# Patient Record
Sex: Male | Born: 1975 | Race: Black or African American | Hispanic: No | Smoking: Never smoker
Health system: Southern US, Community
[De-identification: ages and names within clinical notes are randomized; demographics above are authoritative.]

---

## 1998-12-31 ENCOUNTER — Emergency Department (HOSPITAL_COMMUNITY): Admission: EM | Admit: 1998-12-31 | Discharge: 1998-12-31 | Payer: Self-pay | Admitting: Emergency Medicine

## 1999-01-04 ENCOUNTER — Emergency Department (HOSPITAL_COMMUNITY): Admission: EM | Admit: 1999-01-04 | Discharge: 1999-01-04 | Payer: Self-pay | Admitting: Emergency Medicine

## 2001-03-11 ENCOUNTER — Emergency Department (HOSPITAL_COMMUNITY): Admission: EM | Admit: 2001-03-11 | Discharge: 2001-03-11 | Payer: Self-pay | Admitting: Emergency Medicine

## 2001-08-11 ENCOUNTER — Emergency Department (HOSPITAL_COMMUNITY): Admission: EM | Admit: 2001-08-11 | Discharge: 2001-08-11 | Payer: Self-pay | Admitting: Emergency Medicine

## 2001-08-17 ENCOUNTER — Emergency Department (HOSPITAL_COMMUNITY): Admission: EM | Admit: 2001-08-17 | Discharge: 2001-08-17 | Payer: Self-pay | Admitting: Emergency Medicine

## 2001-08-17 ENCOUNTER — Encounter: Payer: Self-pay | Admitting: Emergency Medicine

## 2004-04-30 ENCOUNTER — Emergency Department (HOSPITAL_COMMUNITY): Admission: EM | Admit: 2004-04-30 | Discharge: 2004-04-30 | Payer: Self-pay | Admitting: *Deleted

## 2004-05-02 ENCOUNTER — Emergency Department (HOSPITAL_COMMUNITY): Admission: EM | Admit: 2004-05-02 | Discharge: 2004-05-02 | Payer: Self-pay | Admitting: Family Medicine

## 2004-11-18 ENCOUNTER — Encounter: Admission: RE | Admit: 2004-11-18 | Discharge: 2004-11-18 | Payer: Self-pay | Admitting: Specialist

## 2007-05-25 ENCOUNTER — Emergency Department (HOSPITAL_COMMUNITY): Admission: EM | Admit: 2007-05-25 | Discharge: 2007-05-25 | Payer: Self-pay | Admitting: Family Medicine
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
## 2013-10-11 ENCOUNTER — Encounter (HOSPITAL_BASED_OUTPATIENT_CLINIC_OR_DEPARTMENT_OTHER): Payer: Self-pay | Admitting: Emergency Medicine

## 2013-10-11 ENCOUNTER — Emergency Department (HOSPITAL_BASED_OUTPATIENT_CLINIC_OR_DEPARTMENT_OTHER)
Admission: EM | Admit: 2013-10-11 | Discharge: 2013-10-11 | Disposition: A | Discharge status: Home / Self Care | Payer: BC Managed Care – PPO | Attending: Emergency Medicine | Admitting: Emergency Medicine

## 2013-10-11 DIAGNOSIS — K5900 Constipation, unspecified: Secondary | ICD-10-CM | POA: Diagnosis present

## 2013-10-11 DIAGNOSIS — R3129 Other microscopic hematuria: Secondary | ICD-10-CM | POA: Insufficient documentation

## 2013-10-11 DIAGNOSIS — R109 Unspecified abdominal pain: Secondary | ICD-10-CM | POA: Diagnosis not present

## 2013-10-11 LAB — URINALYSIS, ROUTINE W REFLEX MICROSCOPIC
Bilirubin Urine: NEGATIVE
Glucose, UA: NEGATIVE mg/dL
Ketones, ur: NEGATIVE mg/dL
Leukocytes, UA: NEGATIVE
Nitrite: NEGATIVE
Protein, ur: NEGATIVE mg/dL
Specific Gravity, Urine: 1.012 (ref 1.005–1.030)
Urobilinogen, UA: 0.2 mg/dL (ref 0.0–1.0)
pH: 7.5 (ref 5.0–8.0)

## 2013-10-11 LAB — URINE MICROSCOPIC-ADD ON

## 2013-10-11 MED ORDER — POLYETHYLENE GLYCOL 3350 17 G PO PACK: 17.0000 g | PACK | Freq: Every day | ORAL | Status: AC

## 2013-10-11 MED ORDER — DOCUSATE SODIUM 100 MG PO CAPS: 100.0000 mg | ORAL_CAPSULE | Freq: Two times a day (BID) | ORAL | Status: AC

## 2013-10-11 MED ORDER — PANTOPRAZOLE SODIUM 20 MG PO TBEC: 20.0000 mg | DELAYED_RELEASE_TABLET | Freq: Every day | ORAL | Status: AC

## 2013-10-11 NOTE — Discharge Instructions (Signed)
Constipation °Constipation is when a person has fewer than three bowel movements a week, has difficulty having a bowel movement, or has stools that are dry, hard, or larger than normal. As people grow older, constipation is more common. If you try to fix constipation with medicines that make you have a bowel movement (laxatives), the problem may get worse. Long-term laxative use may cause the muscles of the colon to become weak. A low-fiber diet, not taking in enough fluids, and taking certain medicines may make constipation worse.  °CAUSES  °· Certain medicines, such as antidepressants, pain medicine, iron supplements, antacids, and water pills.   °· Certain diseases, such as diabetes, irritable bowel syndrome (IBS), thyroid disease, or depression.   °· Not drinking enough water.   °· Not eating enough fiber-rich foods.   °· Stress or travel.   °· Lack of physical activity or exercise.   °· Ignoring the urge to have a bowel movement.   °· Using laxatives too much.   °SIGNS AND SYMPTOMS  °· Having fewer than three bowel movements a week.   °· Straining to have a bowel movement.   °· Having stools that are hard, dry, or larger than normal.   °· Feeling full or bloated.   °· Pain in the lower abdomen.   °· Not feeling relief after having a bowel movement.   °DIAGNOSIS  °Your health care provider will take a medical history and perform a physical exam. Further testing may be done for severe constipation. Some tests may include: °· A barium enema X-ray to examine your rectum, colon, and, sometimes, your small intestine.   °· A sigmoidoscopy to examine your lower colon.   °· A colonoscopy to examine your entire colon. °TREATMENT  °Treatment will depend on the severity of your constipation and what is causing it. Some dietary treatments include drinking more fluids and eating more fiber-rich foods. Lifestyle treatments may include regular exercise. If these diet and lifestyle recommendations do not help, your health care  provider may recommend taking over-the-counter laxative medicines to help you have bowel movements. Prescription medicines may be prescribed if over-the-counter medicines do not work.  °HOME CARE INSTRUCTIONS  °· Eat foods that have a lot of fiber, such as fruits, vegetables, whole grains, and beans. °· Limit foods high in fat and processed sugars, such as french fries, hamburgers, cookies, candies, and soda.   °· A fiber supplement may be added to your diet if you cannot get enough fiber from foods.   °· Drink enough fluids to keep your urine clear or pale yellow.   °· Exercise regularly or as directed by your health care provider.   °· Go to the restroom when you have the urge to go. Do not hold it.   °· Only take over-the-counter or prescription medicines as directed by your health care provider. Do not take other medicines for constipation without talking to your health care provider first.   °SEEK IMMEDIATE MEDICAL CARE IF:  °· You have bright red blood in your stool.   °· Your constipation lasts for more than 4 days or gets worse.   °· You have abdominal or rectal pain.   °· You have thin, pencil-like stools.   °· You have unexplained weight loss. °MAKE SURE YOU:  °· Understand these instructions. °· Will watch your condition. °· Will get help right away if you are not doing well or get worse. °Document Released: 10/26/2003 Document Revised: 02/01/2013 Document Reviewed: 11/08/2012 °ExitCare® Patient Information ©2015 [employer]. This information is not intended to replace advice given to you by your health care provider. Make sure you discuss any questions   you have with your health care provider.   High-Fiber Diet Fiber is found in fruits, vegetables, and grains. A high-fiber diet encourages the addition of more whole grains, legumes, fruits, and vegetables in your diet. The recommended amount of fiber for adult males is 38 g per day. For adult females, it is 25 g per day. Pregnant and lactating women  should get 28 g of fiber per day. If you have a digestive or bowel problem, ask your caregiver for advice before adding high-fiber foods to your diet. Eat a variety of high-fiber foods instead of only a select few type of foods.  PURPOSE  To increase stool bulk.  To make bowel movements more regular to prevent constipation.  To lower cholesterol.  To prevent overeating. WHEN IS THIS DIET USED?  It may be used if you have constipation and hemorrhoids.  It may be used if you have uncomplicated diverticulosis (intestine condition) and irritable bowel syndrome.  It may be used if you need help with weight management.  It may be used if you want to add it to your diet as a protective measure against atherosclerosis, diabetes, and cancer. SOURCES OF FIBER  Whole-grain breads and cereals.  Fruits, such as apples, oranges, bananas, berries, prunes, and pears.  Vegetables, such as green peas, carrots, sweet potatoes, beets, broccoli, cabbage, spinach, and artichokes.  Legumes, such split peas, soy, lentils.  Almonds. FIBER CONTENT IN FOODS Starches and Grains / Dietary Fiber (g)  Cheerios, 1 cup / 3 g  Corn Flakes cereal, 1 cup / 0.7 g  Rice crispy treat cereal, 1 cup / 0.3 g  Instant oatmeal (cooked),  cup / 2 g  Frosted wheat cereal, 1 cup / 5.1 g  Brown, long-grain rice (cooked), 1 cup / 3.5 g  White, long-grain rice (cooked), 1 cup / 0.6 g  Enriched macaroni (cooked), 1 cup / 2.5 g Legumes / Dietary Fiber (g)  Baked beans (canned, plain, or vegetarian),  cup / 5.2 g  Kidney beans (canned),  cup / 6.8 g  Pinto beans (cooked),  cup / 5.5 g Breads and Crackers / Dietary Fiber (g)  Plain or honey graham crackers, 2 squares / 0.7 g  Saltine crackers, 3 squares / 0.3 g  Plain, salted pretzels, 10 pieces / 1.8 g  Whole-wheat bread, 1 slice / 1.9 g  White bread, 1 slice / 0.7 g  Raisin bread, 1 slice / 1.2 g  Plain bagel, 3 oz / 2 g  Flour tortilla, 1 oz  / 0.9 g  Corn tortilla, 1 small / 1.5 g  Hamburger or hotdog bun, 1 small / 0.9 g Fruits / Dietary Fiber (g)  Apple with skin, 1 medium / 4.4 g  Sweetened applesauce,  cup / 1.5 g  Banana,  medium / 1.5 g  Grapes, 10 grapes / 0.4 g  Orange, 1 small / 2.3 g  Raisin, 1.5 oz / 1.6 g  Melon, 1 cup / 1.4 g Vegetables / Dietary Fiber (g)  Green beans (canned),  cup / 1.3 g  Carrots (cooked),  cup / 2.3 g  Broccoli (cooked),  cup / 2.8 g  Peas (cooked),  cup / 4.4 g  Mashed potatoes,  cup / 1.6 g  Lettuce, 1 cup / 0.5 g  Corn (canned),  cup / 1.6 g  Tomato,  cup / 1.1 g Document Released: 01/27/2005 Document Revised: 07/29/2011 Document Reviewed: 05/01/2011 ExitCare Patient Information 2015 [place], [place]. This information is not intended to  replace advice given to you by your health care provider. Make sure you discuss any questions you have with your health care provider.   Hematuria Hematuria is blood in your urine. It can be caused by a bladder infection, kidney infection, prostate infection, kidney stone, or cancer of your urinary tract. Infections can usually be treated with medicine, and a kidney stone usually will pass through your urine. If neither of these is the cause of your hematuria, further workup to find out the reason may be needed. It is very important that you tell your health care provider about any blood you see in your urine, even if the blood stops without treatment or happens without causing pain. Blood in your urine that happens and then stops and then happens again can be a symptom of a very serious condition. Also, pain is not a symptom in the initial stages of many urinary cancers. HOME CARE INSTRUCTIONS   Drink lots of fluid, 3-4 quarts a day. If you have been diagnosed with an infection, cranberry juice is especially recommended, in addition to large amounts of water.  Avoid caffeine, tea, and carbonated beverages because they tend to  irritate the bladder.  Avoid alcohol because it may irritate the prostate.  Take all medicines as directed by your health care provider.  If you were prescribed an antibiotic medicine, finish it all even if you start to feel better.  If you have been diagnosed with a kidney stone, follow your health care provider's instructions regarding straining your urine to catch the stone.  Empty your bladder often. Avoid holding urine for long periods of time.  After a bowel movement, women should cleanse front to back. Use each tissue only once.  Empty your bladder before and after sexual intercourse if you are a male. SEEK MEDICAL CARE IF:  You develop back pain.  You have a fever.  You have a feeling of sickness in your stomach (nausea) or vomiting.  Your symptoms are not better in 3 days. Return sooner if you are getting worse. SEEK IMMEDIATE MEDICAL CARE IF:   You develop severe vomiting and are unable to keep the medicine down.  You develop severe back or abdominal pain despite taking your medicines.  You begin passing a large amount of blood or clots in your urine.  You feel extremely weak or faint, or you pass out. MAKE SURE YOU:   Understand these instructions.  Will watch your condition.  Will get help right away if you are not doing well or get worse. Document Released: 01/27/2005 Document Revised: 06/13/2013 Document Reviewed: 09/27/2012 [facility] Patient Information 2015 [place], [place]. This information is not intended to replace advice given to you by your health care provider. Make sure you discuss any questions you have with your health care provider.     Emergency Department Resource Guide 1) Find a Doctor and Pay Out of Pocket Although you won't have to find out who is covered by your insurance plan, it is a good idea to ask around and get recommendations. You will then need to call the office and see if the doctor you have chosen will accept you as a new  patient and what types of options they offer for patients who are self-pay. Some doctors offer discounts or will set up payment plans for their patients who do not have insurance, but you will need to ask so you aren't surprised when you get to your appointment.  2) Contact Your Local Health Department Not  all health departments have doctors that can see patients for sick visits, but many do, so it is worth a call to see if yours does. If you don't know where your local health department is, you can check in your phone book. The CDC also has a tool to help you locate your state's health department, and many state websites also have listings of all of their local health departments.  3) Find a Walk-in Clinic If your illness is not likely to be very severe or complicated, you may want to try a walk in clinic. These are popping up all over the country in pharmacies, drugstores, and shopping centers. They're usually staffed by nurse practitioners or physician assistants that have been trained to treat common illnesses and complaints. They're usually fairly quick and inexpensive. However, if you have serious medical issues or chronic medical problems, these are probably not your best option.  No Primary Care Doctor: - Call Health Connect at  [phone] - they can help you locate a primary care doctor that  accepts your insurance, provides certain services, etc. - Physician Referral Service- [phone]  Chronic Pain Problems: Organization         Address  Phone   Notes  [facility]  [phone] Patients need to be referred by their primary care doctor.   Medication Assistance: Organization         Address  Phone   Notes  [facility] Medication [facility] [address] [place] [place], [place] [address] [phone] --Must be a resident of [facility] -- Must have NO insurance coverage whatsoever (no Medicaid/ Medicare, etc.) -- The pt. MUST have a primary  care doctor that directs their care regularly and follows them in the community   [facility]  [phone]   [employer]  [phone]    Agencies that provide inexpensive medical care: Organization         Address  Phone   Notes  [facility]  [phone]   [facility]    [phone]   [facility] [address] [place], [place] [address] [phone]   [facility] [address] [place], [place] [phone]   Planned Parenthood    [phone]   [facility]    [phone]   Community Health and [facility]  [address], [place] Phone:  [phone], Fax:  [phone] Hours of Operation:  9 am - 6 pm, M-F.  Also accepts Medicaid/Medicare and self-pay.  [facility]  [address], [place] Phone: [phone], Fax: [phone]. Hours of Operation:  8:30 am - 5:30 pm, M-F.  Also accepts Medicaid and self-pay.  [facility] [place] [address], [place] Phone: [phone]   [facility] [address] Natasha Bence [place], [place] [phone], Ext. [phone] Mondays & Thursdays: 7-9 AM.  First 15 patients are seen on a first come, first serve basis.    Medicaid-accepting [facility] Providers:  Organization         Address  Phone   Notes  [facility] [address], [place] [phone] Also accepts self-pay patients.  [facility] [address] Laurell Josephs [place], [place]  [phone]   [facility] [address], [place] [phone]   [facility] [address], [place] [phone]   Renaye Rakers [address], [place]   [phone] Only accepts [place] Access [place] patients after they have their name applied to their card.   Self-Pay (no insurance) in [facility]:  Organization         Address  Phone   Notes  Sickle Cell Patients, [facility] [address] [place], [place] [phone]   [facility] [address] [place], [place] [phone]   [facility] [place]  [address], [place] [phone]   [facility]/Dr. Osei-Bonsu  [address] [place] or [address], [place] [phone] Phone number for both [place] and [place] locations is the same.  [facility] [address], [place] [phone]   [facility] [address], [place] or [address] [phone]   [facility] [address], [place] [phone], phone; [phone], fax Sees patients 1st and 3rd Saturday of every month.  Must not qualify for public or private insurance (i.e. Medicaid, Medicare, [place] Health Choice, Veterans' Benefits)  Household income should be no more than 200% of the poverty level The clinic cannot treat you if you are pregnant or think you are pregnant  Sexually transmitted diseases are not treated at the clinic.    Dental Care: Organization         Address  Phone  Notes  [facility] Behavioral [facility] [address] [place], [place] [phone] Accepts children up to age 72 who are enrolled in [place] or [place] Health Choice; pregnant women with a Medicaid card; and children who have applied for Medicaid or [place] Health Choice, but were declined, whose parents can pay a reduced fee at time of service.  [facility] Department of [facility]  [address], [place] [phone] Accepts children up to age 63 who are enrolled in [place] or [place] Health Choice; pregnant women with a Medicaid card; and children who have applied for Medicaid or  Health Choice, but were declined, whose parents can  pay a reduced fee at time of service.  [facility]  [address] [place], [place] [phone] Patients are seen by appointment only. Walk-ins are not accepted. [facility] will see patients 19 years of age and older. Monday - Tuesday (8am-5pm) Most Wednesdays (8:30-5pm) $30 per visit, cash only  [facility] Adult [facility] Access PRO[facility]  [address], [facility] [phone] Patients are seen by appointment only. Walk-ins are not accepted. [facility] will see patients 51 years of age and older. One Wednesday Evening (Monthly: Volunteer Based).  $30 per visit, cash only  [employer]  [phone] for adults; Children under age 65, call Graduate Pediatric Dentistry at [phone]. Children aged 78-14, please call [phone] to request a pediatric application.  Dental services are provided in all areas of dental care including fillings, crowns and bridges, complete and partial dentures, implants, gum treatment, root canals, and extractions. Preventive care is also provided. Treatment is provided to both adults and children. Patients are selected via a lottery and there is often a waiting list.   [facility] [address], [place]  [phone] [email or web address]   [facility] [address] [place], [place] [phone], Ext. [phone] Second and Fourth Thursday of each month, opens at 6:30 AM; Clinic ends at 9 AM.  Patients are seen on a first-come first-served basis, and a limited number are seen during each clinic.   [facility]  [address] Ether Griffins [place], [place] [phone]   Eligibility Requirements You must have lived in [place], [place], or [place] counties for at least the last three months.   You cannot be eligible for state or federal sponsored [place], including [employer], [place], or [employer].   You generally cannot be eligible for healthcare  insurance through your employer.    How to apply: Eligibility screenings are held every Tuesday and Wednesday afternoon from 1:00 pm until 4:00 pm. You do not need an appointment for the interview!  [facility] [address] [place], [place] [phone]   [facility] Health Department  [phone]   [facility] Health Department  [phone]   [facility] Health Department  [phone]    Behavioral Health Resources in the Community: Intensive Outpatient Programs Organization         Address  Phone  Notes  [facility] [address] [place], [place] [phone]   [facility] [address], [place], [place] [phone]   [facility] [address] [place], [place]  [phone]   [facility] Health [address]  [place], [place] [phone] or [phone]   Substance Abuse Resources Organization         Address  Phone  Notes  [facility]  [phone]   [facility]  [phone]   [facility]  [phone]   Floydene Flock  [phone]   Residential & Outpatient Substance Abuse Program  [phone]   Psychological Services Organization         Address  Phone  Notes  [facility] Behavioral Health  [phone]   [facility]  [phone]   [facility] Mental Health [address] [place] [phone] or [phone]    Mobile Crisis Teams Organization         Address  Phone  Notes  [facility], Mobile Crisis Care Unit  [phone]   [facility]  [address] [place], [place] [phone]   Doristine Locks [address] [place] [phone]    Self-Help/Support Groups Organization         Address  Phone             Notes  [facility] [place] - variety of support groups  [phone]- [phone] Call for more information  Narcotics Anonymous (NA),  [facility] [address], [employer] [place]  2 meetings at this location   Statistician         Address  Phone  Notes  [facility] [address],    [place]  [phone]   [facility]  [address] [place] [address], [place], [place] [phone]   [facility] [address] [place], [place] [phone] Admissions: 8am-3pm M-F  [facility] [address],    [place], [place] [phone]   [facility] [address] Starling Manns [place], [place] [phone]   [facility] [address],  [place], [place] [phone]   [facility] - Intensive Outpatient [phone]  [address], [place], [place] [phone]   [facility] ([facility] [address]  [place], [place] [phone] or [phone]   [facility] ([facility]) [address], [place], [place] [phone] Accepts Medicaid  [facility] [address],  [place] [phone] Substance Abuse/Addiction Treatment   [facility] Organization         Address  Phone  Notes  [facility]  [phone]   Angie Fava, PhD [address] Ervin Knack [place], [place]   [phone] or [phone]   [facility] Behavioral   [address] [place], [place] [phone]   [facility] [address] [place], [place] [phone] Insurance/Medicaid/sponsorship through [facility] [address]                                    [place], [place] [phone] Therapy/tele-psych/case  [facility] [address] [place], [place] [phone]    Dr. Lolly Mustache  [phone]   [facility]  [employer] [facility] Dept. 1) [address], [place] 2) [address] [place] 3)  [address], [place] [phone]  [phone]    [facility] Child Abuse Hotline [phone] or [phone] (After Hours)

## 2013-10-11 NOTE — ED Notes (Signed)
Pt c/o abdominal bloating, gas and constipation; states "i'm not digesting food at all", states last bm yesterday, denies n/v

## 2013-10-11 NOTE — ED Provider Notes (Signed)
CSN: [id number]     Arrival date & time 10/11/13  2036 History  This chart was scribed for Audree Camel, MD by Roxy Cedar, ED Scribe. This patient was seen in room MH11/MH11 and the patient's care was started at 11:18 PM.  Chief Complaint  Patient presents with  . Constipation   The history is provided by the patient. No language interpreter was used.   HPI Comments: Charles Maddox is a 38 y.o. male who presents to the Emergency Department complaining of constant constipation and difficulty digesting food that began 1 month ago. Patient states that his pain worsens with oil and nut based foods. Patient states that he used to take a laxative medication 3 to 4 times daily, but has stopped taking it on a daily basis. His last episode of pain was last night. Patient states that his last bowel movement was yesterday and that he had hard stool. Patient has associated periumbilical abdominal pain. Patient denies associated vomiting and diarrhea. Patient states he is not currently taking any medications. Patient states that his normal diet consists of eating out. He feels like he has burning going up his chest during certain foods and feels like certain foods aren't digested. Denies dysuria or hematuria.  History reviewed. No pertinent past medical history. History reviewed. No pertinent past surgical history. No family history on file. History  Substance Use Topics  . Smoking status: Never Smoker   . Smokeless tobacco: Not on file  . Alcohol Use: No    Review of Systems  Gastrointestinal: Positive for abdominal pain and constipation. Negative for nausea and vomiting.  Genitourinary: Negative for dysuria, frequency and hematuria.  All other systems reviewed and are negative.  Allergies  Review of patient's allergies indicates no known allergies.  Home Medications   Prior to Admission medications   Not on File   Triage Vitals: BP 133/84  Pulse 59  Temp(Src) 98.3 F (36.8 C)  (Oral)  Resp 20  Ht  (1.651 m)  Wt 155 lb (70.308 kg)  BMI 25.79 kg/m2  SpO2 100%  Physical Exam  Nursing note and vitals reviewed. Constitutional: He is oriented to person, place, and time. He appears well-developed and well-nourished. No distress.  HENT:  Head: Normocephalic and atraumatic.  Cardiovascular: Normal rate, regular rhythm and normal heart sounds.   Pulmonary/Chest: Effort normal and breath sounds normal. No respiratory distress.  Abdominal: Soft. He exhibits no distension. There is no tenderness.  Neurological: He is alert and oriented to person, place, and time.  Skin: Skin is warm and dry.  Psychiatric: He has a normal mood and affect. His behavior is normal.   ED Course  Procedures (including critical care time)  DIAGNOSTIC STUDIES: Oxygen Saturation is 100% on RA, normal by my interpretation.    COORDINATION OF CARE: 11:24 PM- Discussed plans to order urinalysis. Advised patient to schedule appointment with GI specialist. Pt advised of plan for treatment and pt agrees.  Labs Review Labs Reviewed  URINALYSIS, ROUTINE W REFLEX MICROSCOPIC - Abnormal; Notable for the following:    Hgb urine dipstick SMALL (*)    All other components within normal limits  URINE MICROSCOPIC-ADD ON   Imaging Review No results found.   EKG Interpretation None      MDM   Final diagnoses:  Constipation, unspecified constipation type  Microscopic hematuria    Patient's exam is normal. No tenderness or distention noted his abdomen. He appears to have chronic constipation. Initially had a bowel movement  yesterday and this my concern of bowel obstruction or impaction is quite low. At this time will recommend Colace and MiraLAX. Will also give advice for better diet. He also seems to be having reflux, we'll start him on Protonix. Given that he has no pain in the last 24 hours and no tenderness on exam I feel lab work is not needed as well as CT imaging is not needed. Urine  was obtained by nursing staff in triage shows small amount of microscopic hematuria. Patient has no urine symptoms. At this time we'll recommend a followup with a PCP for his GI and urine issues to be followed up as an outpatient.  I personally performed the services described in this documentation, which was scribed in my presence. The recorded information has been reviewed and is accurate.    Audree Camel, MD 10/12/13 [phone]

## 2014-01-11 ENCOUNTER — Ambulatory Visit: Payer: BC Managed Care – PPO | Admitting: Gastroenterology

## 2017-08-28 ENCOUNTER — Other Ambulatory Visit: Admit: 2017-08-28 | Payer: PRIVATE HEALTH INSURANCE

## 2017-08-28 ENCOUNTER — Ambulatory Visit: Admit: 2017-08-28 | Payer: PRIVATE HEALTH INSURANCE

## 2017-08-28 DIAGNOSIS — Z0000 Encounter for general adult medical examination without abnormal findings: Secondary | ICD-10-CM

## 2017-08-28 LAB — LIPID PANEL
Cholesterol, Total: 222 mg/dL — ABNORMAL HIGH (ref 0–200)
HDL: 55 mg/dL — ABNORMAL LOW (ref 60–92)
LDL Cholesterol: 151 mg/dL
Triglycerides: 80 mg/dL (ref 10–149)

## 2017-08-28 LAB — HEPATITIS C ANTIBODY
HCV Ab: NONREACTIVE
HCVAB Number: 0.09 S/CO (ref 0.00–0.79)

## 2017-08-28 LAB — HEPATITIS B CORE IGM: Hep B Core IgM: NONREACTIVE

## 2017-08-28 LAB — PSA TOTAL, SCREENING: PSA: 1.33 ng/mL (ref 0.0–4.0)

## 2017-08-28 LAB — HEPATITIS B SURFACE ANTIGEN: Hep B Surface Ag: NONREACTIVE

## 2017-08-28 LAB — HEPATITIS A IGM: Hep A IgM: NONREACTIVE

## 2017-08-28 NOTE — Progress Notes (Signed)
SUBJECTIVE:     Corey Barker is a 42 y.o. male who presents for annual exam.  The current complaints will be listed in the ROS  Immunizations: unknown status, parent to bring shot records    There is no immunization history on file for this patient.  Tested for Hepatitis C: No    Past Medical History:   Diagnosis Date    Seasonal allergies      History reviewed. No pertinent surgical history.  Family History   Problem Relation Age of Onset    Prostate Cancer Father         2     Current Outpatient Prescriptions   Medication Sig Dispense Refill    AMOXicillin (AMOXIL) 875 MG tablet Take 875 mg by mouth 2 times a day.       No current facility-administered medications for this visit.      No Known Allergies  Social History     Social History    Marital status: Married     Spouse name: N/A    Number of children: N/A    Years of education: N/A     Occupational History    Not on file.     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       Review of Systems  Negative except   GI: constipation has been better- but used to bother patient a lot.  Changed his diet.  BM 1-2x a day.      Mouth issues.  Dentist removed 2 of teeth a week ago.  Pt is on abx and recovering well from it.  Feels that things are improving, but felt febrile- no temp taken.     OBJECTIVE:   Physical Exam:  BP 120/81   Pulse 69   Temp 98 F (36.7 C) (Temporal)   Ht 5' 6  (1.676 m)   Wt 165 lb (74.8 kg)   BMI 26.63 kg/m   General:  Alert, cooperative, no distress, appears stated age.   Head:  Normocephalic, without obvious abnormality, atraumatic.   Eyes:  Conjunctivae/corneas clear. PERRL, EOMs intact. Fundi benign   Ears:  Normal TMs and external ear canals both ears.   Nose: Nares normal. Septum midline. Mucosa normal. No drainage or sinus tenderness.   Throat: Lips, mucosa, and tongue normal. 2 mandibular molars removed. No erythema or purlence   Neck: Supple, symmetrical, trachea midline, no adenopathy, thyroid: no enlargment/tenderness/nodules, no carotid bruit and no JVD.   Back:   Symmetric, no curvature. ROM normal. No CVA tenderness.   Lungs:   Clear to auscultation bilaterally.   Chest wall:  No tenderness or deformity.   Heart:  Regular rate and rhythm, S1, S2 normal, no murmur, click, rub or gallop.   Abdomen:   Soft, non-tender. Bowel sounds normal. No masses,  No organomegaly.   Genitalia:  deferred   Rectal:  deferred   Extremities: Extremities normal, atraumatic, no cyanosis or edema.   Pulses: 2+ and symmetric all extremities.   Skin: Skin color, texture, turgor normal. No rashes or lesions   Lymph nodes: Cervical nodes normal.   Neurologic: Normal strength, sensation and reflexes throughout.        ASSESSMENT AND PLAN       Corey Barker was seen today for establish care.    Diagnoses and all orders for this visit:    Annual physical exam  -     PSA Total, Screening; Future  -     Lipid Profile; Future  -     Acute Hepatitis Panel; Future    Family history of prostate cancer    Other orders  -     Tdap vaccine >/= 41yo IM (BOOSTRIX)  -     Hepatitis A vaccine adult IM (HAVRIX)    ok to screen early    Anticipatory guidance:    Pt was given anticipatory guidance counseling including but not limited to:       Cancer screening recommendations  Diet and exercise    FOLLOW UP:   Follow up in 1 year(s)    Return sooner if problem recurs,  worsens, or new problem develops.

## 2018-02-19 NOTE — Unmapped (Signed)
Left message informing.

## 2018-02-19 NOTE — Unmapped (Signed)
Tb test ordered.  1 step tb test.  Blood draw. Our lab isn't open sat

## 2018-02-19 NOTE — Unmapped (Signed)
Pt dropped off paperwork for [employer]. He is requesting an order for Quantifer[employer] gold. He needs everything for school on Mon. He states they just gave him the paperwork. He will have to be drawn Sat and is aware that Dr. Marchia Bond won't be in till Tues and will have to pick up the paperwork then. Paperwork given to [employer].

## 2018-02-22 ENCOUNTER — Other Ambulatory Visit: Admit: 2018-02-22 | Payer: PRIVATE HEALTH INSURANCE

## 2018-02-22 ENCOUNTER — Ambulatory Visit: Admit: 2018-02-22 | Payer: PRIVATE HEALTH INSURANCE

## 2018-02-22 DIAGNOSIS — R509 Fever, unspecified: Secondary | ICD-10-CM

## 2018-02-22 DIAGNOSIS — Z111 Encounter for screening for respiratory tuberculosis: Secondary | ICD-10-CM

## 2018-02-22 LAB — POCT INFLUENZA A/B
Rapid Influenza A Ag: POSITIVE
Rapid Influenza B Ag: POSITIVE

## 2018-02-22 LAB — QUANTIFERON TB GOLD PLUS
QuantiFERON Mitogen: >10 IU/mL
QuantiFERON Nil: 0.29 IU/mL
QuantiFERON TB Gold: NEGATIVE
QuantiFERON TB1 Ag Value: 0.32 IU/mL
QuantiFERON TB2 Ag Value: 0.29 IU/mL

## 2018-02-22 MED ORDER — oseltamivir (TAMIFLU) 75 MG capsule
75 | ORAL_CAPSULE | Freq: Two times a day (BID) | ORAL | 0 refills | Status: AC
Start: 2018-02-22 — End: ?

## 2018-02-22 MED ORDER — benzonatate (TESSALON) 100 MG capsule
100 | ORAL_CAPSULE | Freq: Three times a day (TID) | ORAL | 0 refills | Status: AC | PRN
Start: 2018-02-22 — End: ?

## 2018-02-22 NOTE — Unmapped (Signed)
Corey Barker is a 43 y.o. male with history seasonal allergies presenting with flu-like symptoms.    SUBJECTIVE     Chief Complaint: flu-like symptoms    History of Present Illness:  Day 3 of yellow, productive cough with yellow sputum, congestion, rhinorrhea, and subjective fever with chills, malaise, and 8/10 body aches. Chest pain when coughing is the biggest complaint and concern. Did not receive flu shot this year.    Denies nausea, emesis, diarrhea, constipation, DIB, CP with cough but no underlying CP.     Review of Systems:  Constitutional Symptoms: no fever/chills, no weight loss/gain, no fatigue  Eyes: no vision changes  Ears, nose, mouth, throat: no rhinorrhea, no hearing changes, no masses   Cardiovascular: no palpitations, no chest pain   Respiratory: no difficulty breathing   Gastrointestinal: no nausea, no vomiting, no diarrhea, no constipation, no obstipation, no tenesmus, no early satiety  Genitourinary: no urgency, no frequency, no polyuria, no dysuria, no hematuria  Musculoskeletal: no muscle pain, no joint pain   Integumentary (skin and/or breast): no rashes   Neurological: no numbness, no tingling, no pain   Psychiatric: no mood changes   Endocrine: no polydipsia, no polyuria, no polyphagia, no temp intolerance   Hematologic/lymphatic: no pedal edema, no easy bruising/bleeding  Allergic/immunologic: no itching    Medical / Surgical History  Patient has a past medical history of Seasonal allergies.    Patient has no past surgical history on file.    Medications  Outpatient Encounter Medications as of 02/22/2018   Medication Sig Dispense Refill   ??? AMOXicillin (AMOXIL) 875 MG tablet Take 875 mg by mouth 2 times a day.       No facility-administered encounter medications on file as of 02/22/2018.        Social / Family History  Patient reports that he has never smoked. He has never used smokeless tobacco. He reports that he does not drink alcohol or use drugs.    Patient's family history includes  Prostate Cancer in his father and paternal uncle.    The following portions of the patient's history were reviewed and updated as appropriate: past medical history, past surgical history, current medications, allergies, past social history, past family history, problem list.      OBJECTIVE     Physical exam  Vital signs and nursing notes reviewed.  Blood pressure 121/86, pulse 73, temperature 98.7 ??F (37.1 ??C), temperature source Temporal, height 5' 8 (1.727 m), weight 170 lb (77.1 kg).      Physical Exam:  Vitals signs reviewed.   Constitutional:       General: Not in acute distress.     Appearance: Normal appearance.   HENT:      Head: Normocephalic and atraumatic.      Right Ear: External ear normal. Clear TM     Left Ear: External ear normal. Clear TM     Mouth/Throat: No oral lesions.      Pharynx: Oropharynx is clear.   Eyes:      General: No scleral icterus.     Extraocular Movements: Extraocular movements intact.      Conjunctiva/sclera: Conjunctivae normal.   Cardiovascular:      Rate and Rhythm: Normal rate and regular rhythm.      Heart sounds: Normal heart sounds.   Pulmonary:      Effort: Pulmonary effort is normal. No respiratory distress. Clear to auscultation at 6 posterior and 2 anterior points.  Chest:  Chest wall: No tenderness.   Musculoskeletal: Normal range of motion.      Skin:     General: Skin is warm and dry.   Neurological:      General: No focal deficit present.      Mental Status: Alert and oriented to person, place, and time.   Psychiatric:         Mood and Affect: Mood normal.         Behavior: Behavior normal.     Labs: Rapid Flu positive for A and B.     ASSESSMENT/PLAN     Influenza A + B.   Within 72h of symptom onset with positive flu test.    Start oseltamivir.    Encourage fluids   Add ibuprofen   Tessalon pearls.    Work note: provided.     Follow-up: as-needed if no improvement in 4d.    Lawanna Kobus, MS3  Medical Student  [phone]   02/22/2018 3:35 PM

## 2018-02-22 NOTE — Unmapped (Signed)
[facility]  [facility]  [address]  [place] [address]    Name:  Tahmir Kleckner Date of Birth: 01-11-1976 (43 y.o.)   MRN: [id number]    Date of Service:  02/22/2018     Subjective:     Chief Complaint   Patient presents with   ??? Fever   ??? Generalized Body Aches   ??? Cough     History of Present Illness:  Dejion Grillo is a(n) 43 y.o. male here today for the following:   HPI   Started Saturday  Productive cough  Sputum is yellow  +congestion  subjectve fever and chills  Malaise  Generalized aches  +CP with cough   Did not receive flu shot this year. (but will get one next year)  No SOB or DOE  Current Outpatient Medications   Medication Sig Dispense Refill   ??? benzonatate (TESSALON) 100 MG capsule Take 1 capsule (100 mg total) by mouth 3 times a day as needed for Cough. 20 capsule 0   ??? oseltamivir (TAMIFLU) 75 MG capsule Take 1 capsule (75 mg total) by mouth 2 times a day. 10 capsule 0     No current facility-administered medications for this visit.       Review of Systems   Constitutional: Positive for activity change, appetite change, chills, fatigue and fever.   HENT: Positive for congestion. Negative for ear pain, sore throat and trouble swallowing.    Eyes: Negative for pain and redness.   Respiratory: Positive for cough.    Cardiovascular: Positive for chest pain.   Gastrointestinal: Negative for diarrhea, heartburn and vomiting.   Genitourinary: Negative for difficulty urinating.   Musculoskeletal: Positive for arthralgias and myalgias.   Neurological: Positive for headaches.            Objective:     Vitals:    02/22/18 1452   BP: 121/86   Pulse: 73   Temp: 98.7 ??F (37.1 ??C)   Height: 5' 8 (1.727 m)   Weight: 170 lb (77.1 kg)   BMI (Calculated): 25.85     Body mass index is 25.85 kg/m??.    Physical Exam  Vitals signs reviewed.   Constitutional:       Appearance: Normal appearance.   HENT:      Head: Normocephalic and atraumatic.      Right Ear: Tympanic  membrane normal.      Left Ear: Tympanic membrane normal.      Nose: Congestion present. No rhinorrhea.   Eyes:      Extraocular Movements: Extraocular movements intact.      Conjunctiva/sclera: Conjunctivae normal.      Pupils: Pupils are equal, round, and reactive to light.   Neck:      Musculoskeletal: Normal range of motion and neck supple.   Cardiovascular:      Rate and Rhythm: Normal rate and regular rhythm.      Pulses: Normal pulses.   Pulmonary:      Effort: Pulmonary effort is normal.      Breath sounds: Normal breath sounds.   Abdominal:      General: Abdomen is flat. Bowel sounds are normal.      Palpations: Abdomen is soft.   Lymphadenopathy:      Cervical: Cervical adenopathy present.   Skin:     General: Skin is warm.      Capillary Refill: Capillary refill takes less than 2 seconds.  Findings: No rash.   Neurological:      General: No focal deficit present.      Mental Status: He is alert.   Psychiatric:         Mood and Affect: Mood normal.                   Assessment/Plan:       Freddie was seen today for fever, generalized body aches and cough.    Diagnoses and all orders for this visit:    Fever, unspecified fever cause (Primary)  -     POCT Influenza A/B    Influenza A    Other orders  -     oseltamivir (TAMIFLU) 75 MG capsule; Take 1 capsule (75 mg total) by mouth 2 times a day.  -     benzonatate (TESSALON) 100 MG capsule; Take 1 capsule (100 mg total) by mouth 3 times a day as needed for Cough.      Cont supportive care - fluids, rest, note for work, IBU  Call for lack of improvement after day 5 or increased WOB                 Jatniel Verastegui A Inell Mimbs, MD

## 2018-10-11 NOTE — Unmapped (Signed)
This is a notification of a Discharge Alert generated by [facility]. This patient visited the following location: [facility] ([place])Admit Date: 10/11/2018 12:47Discharge Date: 10/11/2018 15:41Visit Type: EmergencyChief complaint: Acute pharyngitis,   unspecified; Sore ThroatDiagnosis: Acute pharyngitis, unspecified; Sore Throat (J02.9; 82)Alert Category: Attending Physician: Lauralee Evener Physician: REFERRAL, SELFConsulting Physician: Copied Physician(s): [facility] is a not-for-profit   corporation that was founded in 1997 as a community effort to enhance the ability to share health information electronically in the [place] area. Today, [facility] is one of the nation????????s largest and most financially sustainable   regional health information exchange (HIE) organizations.

## 2018-10-11 NOTE — Unmapped (Signed)
This is a notification of an ED/Admission Alert generated by [facility]. This patient visited the following location: [facility] ([place])Admit Date: 10/11/2018 12:47Visit Type: EmergencyChief complaint: Sore ThroatDiagnosis: Sore Throat (82)Alert Category:   Attending Physician: Referring Physician: Consulting Physician: Copied Physician(s): [facility] is a not-for-profit corporation that was founded in 1997 as a community effort to enhance the ability to share health information electronically in the   [place] area. Today, [facility] is one of the nation????????s largest and most financially sustainable regional health information exchange (HIE) organizations.
